# Patient Record
Sex: Female | Born: 1935 | Marital: Married
Health system: Southern US, Community
[De-identification: ages and names within clinical notes are randomized; demographics above are authoritative.]

---

## 2004-04-24 ENCOUNTER — Ambulatory Visit: Payer: Self-pay | Admitting: Obstetrics and Gynecology

## 2004-04-25 ENCOUNTER — Ambulatory Visit: Payer: Self-pay | Admitting: Obstetrics and Gynecology

## 2004-05-14 ENCOUNTER — Ambulatory Visit: Payer: Self-pay | Admitting: Gynecologic Oncology

## 2004-05-27 ENCOUNTER — Ambulatory Visit: Payer: Self-pay | Admitting: Gynecologic Oncology

## 2004-06-02 ENCOUNTER — Ambulatory Visit: Payer: Self-pay | Admitting: Gynecologic Oncology

## 2004-07-23 ENCOUNTER — Ambulatory Visit: Payer: Self-pay | Admitting: Oncology

## 2011-06-03 ENCOUNTER — Ambulatory Visit: Payer: Self-pay | Admitting: Internal Medicine

## 2011-06-11 ENCOUNTER — Inpatient Hospital Stay: Payer: Self-pay | Admitting: Specialist

## 2011-06-11 DIAGNOSIS — I517 Cardiomegaly: Secondary | ICD-10-CM

## 2011-06-11 LAB — CBC
HCT: 19.7 % — ABNORMAL LOW (ref 35.0–47.0)
HGB: 6.5 g/dL — ABNORMAL LOW (ref 12.0–16.0)
MCHC: 33 g/dL (ref 32.0–36.0)
MCV: 91 fL (ref 80–100)
RBC: 2.17 10*6/uL — ABNORMAL LOW (ref 3.80–5.20)
RDW: 14.1 % (ref 11.5–14.5)
WBC: 14.6 10*3/uL — ABNORMAL HIGH (ref 3.6–11.0)

## 2011-06-11 LAB — URINALYSIS, COMPLETE
Ketone: NEGATIVE
Ph: 7 (ref 4.5–8.0)
Protein: 30
RBC,UR: 9 /HPF (ref 0–5)
Specific Gravity: 1.021 (ref 1.003–1.030)
WBC UR: 185 /HPF (ref 0–5)

## 2011-06-11 LAB — TROPONIN I: Troponin-I: <0.02 ng/mL

## 2011-06-11 LAB — COMPREHENSIVE METABOLIC PANEL
Anion Gap: 10 (ref 7–16)
BUN: 119 mg/dL — ABNORMAL HIGH (ref 7–18)
Bilirubin,Total: 0.4 mg/dL (ref 0.2–1.0)
Chloride: 98 mmol/L (ref 98–107)
Creatinine: 3.43 mg/dL — ABNORMAL HIGH (ref 0.60–1.30)
EGFR (African American): 13 — ABNORMAL LOW
EGFR (Non-African Amer.): 11 — ABNORMAL LOW
Osmolality: 316 (ref 275–301)
Potassium: 5.5 mmol/L — ABNORMAL HIGH (ref 3.5–5.1)
SGPT (ALT): 26 U/L
Total Protein: 6.5 g/dL (ref 6.4–8.2)

## 2011-06-11 LAB — CK TOTAL AND CKMB (NOT AT ARMC)
CK, Total: 690 U/L — ABNORMAL HIGH (ref 21–215)
CK-MB: 2.2 ng/mL (ref 0.5–3.6)

## 2011-06-11 LAB — HEMOGLOBIN: HGB: 7.5 g/dL — ABNORMAL LOW (ref 12.0–16.0)

## 2011-06-12 LAB — CBC WITH DIFFERENTIAL/PLATELET
Basophil #: 0 10*3/uL (ref 0.0–0.1)
Basophil %: 0.4 %
HCT: 25 % — ABNORMAL LOW (ref 35.0–47.0)
HGB: 8.7 g/dL — ABNORMAL LOW (ref 12.0–16.0)
Lymphocyte %: 13.4 %
Monocyte #: 1.1 x10 3/mm — ABNORMAL HIGH (ref 0.2–0.9)
Monocyte %: 10.1 %
Neutrophil #: 8.2 10*3/uL — ABNORMAL HIGH (ref 1.4–6.5)
Neutrophil %: 75.9 %
Platelet: 138 10*3/uL — ABNORMAL LOW (ref 150–440)
RDW: 14.2 % (ref 11.5–14.5)
WBC: 10.9 10*3/uL (ref 3.6–11.0)

## 2011-06-12 LAB — TROPONIN I: Troponin-I: <0.02 ng/mL

## 2011-06-12 LAB — BASIC METABOLIC PANEL
Anion Gap: 11 (ref 7–16)
BUN: 89 mg/dL — ABNORMAL HIGH (ref 7–18)
Calcium, Total: 8.2 mg/dL — ABNORMAL LOW (ref 8.5–10.1)
Chloride: 105 mmol/L (ref 98–107)
Co2: 24 mmol/L (ref 21–32)
EGFR (African American): 23 — ABNORMAL LOW
EGFR (Non-African Amer.): 20 — ABNORMAL LOW
Glucose: 209 mg/dL — ABNORMAL HIGH (ref 65–99)
Osmolality: 313 (ref 275–301)

## 2011-06-12 LAB — CK TOTAL AND CKMB (NOT AT ARMC)
CK, Total: 582 U/L — ABNORMAL HIGH (ref 21–215)
CK-MB: 1 ng/mL (ref 0.5–3.6)

## 2011-06-13 LAB — BASIC METABOLIC PANEL
Anion Gap: 9 (ref 7–16)
BUN: 51 mg/dL — ABNORMAL HIGH (ref 7–18)
Calcium, Total: 7.7 mg/dL — ABNORMAL LOW (ref 8.5–10.1)
Co2: 23 mmol/L (ref 21–32)
Creatinine: 1.25 mg/dL (ref 0.60–1.30)
EGFR (African American): 44 — ABNORMAL LOW
EGFR (Non-African Amer.): 38 — ABNORMAL LOW
Osmolality: 301 (ref 275–301)
Potassium: 3.4 mmol/L — ABNORMAL LOW (ref 3.5–5.1)

## 2011-06-13 LAB — CBC WITH DIFFERENTIAL/PLATELET
Basophil #: 0 10*3/uL (ref 0.0–0.1)
HCT: 28.2 % — ABNORMAL LOW (ref 35.0–47.0)
HGB: 9.6 g/dL — ABNORMAL LOW (ref 12.0–16.0)
Lymphocyte #: 1.5 10*3/uL (ref 1.0–3.6)
MCH: 30.3 pg (ref 26.0–34.0)
MCHC: 34 g/dL (ref 32.0–36.0)
MCV: 89 fL (ref 80–100)
Monocyte %: 9.7 %
Neutrophil #: 7.1 10*3/uL — ABNORMAL HIGH (ref 1.4–6.5)
Platelet: 141 10*3/uL — ABNORMAL LOW (ref 150–440)
WBC: 9.6 10*3/uL (ref 3.6–11.0)

## 2011-06-13 LAB — TSH: Thyroid Stimulating Horm: 0.591 u[IU]/mL

## 2011-06-13 LAB — URINE CULTURE

## 2011-06-14 LAB — BASIC METABOLIC PANEL
BUN: 43 mg/dL — ABNORMAL HIGH (ref 7–18)
Chloride: 114 mmol/L — ABNORMAL HIGH (ref 98–107)
Co2: 22 mmol/L (ref 21–32)
EGFR (African American): 52 — ABNORMAL LOW
Glucose: 185 mg/dL — ABNORMAL HIGH (ref 65–99)
Potassium: 3.5 mmol/L (ref 3.5–5.1)
Sodium: 144 mmol/L (ref 136–145)

## 2011-06-14 LAB — CBC WITH DIFFERENTIAL/PLATELET
Basophil #: 0 10*3/uL (ref 0.0–0.1)
Basophil %: 0.3 %
Eosinophil %: 0.1 %
HCT: 21.3 % — ABNORMAL LOW (ref 35.0–47.0)
HGB: 7.1 g/dL — ABNORMAL LOW (ref 12.0–16.0)
Lymphocyte #: 1.2 10*3/uL (ref 1.0–3.6)
MCHC: 33.6 g/dL (ref 32.0–36.0)
MCV: 90 fL (ref 80–100)
Monocyte %: 10.7 %
Platelet: 138 10*3/uL — ABNORMAL LOW (ref 150–440)
RBC: 2.36 10*6/uL — ABNORMAL LOW (ref 3.80–5.20)
WBC: 8.2 10*3/uL (ref 3.6–11.0)

## 2011-06-15 LAB — URINALYSIS, COMPLETE
Bilirubin,UR: NEGATIVE
Glucose,UR: >=500 mg/dL (ref 0–75)
Leukocyte Esterase: NEGATIVE
Squamous Epithelial: NONE SEEN

## 2011-06-15 LAB — CBC WITH DIFFERENTIAL/PLATELET
Basophil #: 0 10*3/uL (ref 0.0–0.1)
Basophil %: 0.2 %
Eosinophil #: 0 10*3/uL (ref 0.0–0.7)
HGB: 8.8 g/dL — ABNORMAL LOW (ref 12.0–16.0)
MCH: 30 pg (ref 26.0–34.0)
MCHC: 33.9 g/dL (ref 32.0–36.0)
MCV: 88 fL (ref 80–100)
Monocyte #: 0.9 x10 3/mm (ref 0.2–0.9)
Monocyte %: 8.8 %
Neutrophil #: 6.9 10*3/uL — ABNORMAL HIGH (ref 1.4–6.5)
Neutrophil %: 71 %
Platelet: 147 10*3/uL — ABNORMAL LOW (ref 150–440)
RDW: 14 % (ref 11.5–14.5)
WBC: 9.8 10*3/uL (ref 3.6–11.0)

## 2011-06-17 LAB — BASIC METABOLIC PANEL
Anion Gap: 11 (ref 7–16)
BUN: 20 mg/dL — ABNORMAL HIGH (ref 7–18)
Co2: 20 mmol/L — ABNORMAL LOW (ref 21–32)
Creatinine: 0.73 mg/dL (ref 0.60–1.30)
Glucose: 196 mg/dL — ABNORMAL HIGH (ref 65–99)
Sodium: 145 mmol/L (ref 136–145)

## 2011-06-17 LAB — CBC WITH DIFFERENTIAL/PLATELET
Basophil %: 0.3 %
Eosinophil #: 0.2 10*3/uL (ref 0.0–0.7)
HCT: 24.8 % — ABNORMAL LOW (ref 35.0–47.0)
HGB: 8.1 g/dL — ABNORMAL LOW (ref 12.0–16.0)
Lymphocyte %: 15.5 %
MCHC: 32.7 g/dL (ref 32.0–36.0)
MCV: 90 fL (ref 80–100)
Monocyte #: 0.9 x10 3/mm (ref 0.2–0.9)
Neutrophil #: 7.1 10*3/uL — ABNORMAL HIGH (ref 1.4–6.5)
Neutrophil %: 73.1 %
RDW: 14.1 % (ref 11.5–14.5)

## 2011-07-04 ENCOUNTER — Ambulatory Visit: Payer: Self-pay | Admitting: Internal Medicine

## 2011-08-03 DEATH — deceased

## 2012-05-23 IMAGING — CR PELVIS - 1-2 VIEW
1 series · 2 of 2 positions shown · non-contrast
Comparison: none

REASON FOR EXAM: trauma
COMMENTS:

[Series 1: t pelvis ap · 0.14mm/px · 2 of 2 slices shown]
[im 1/2]
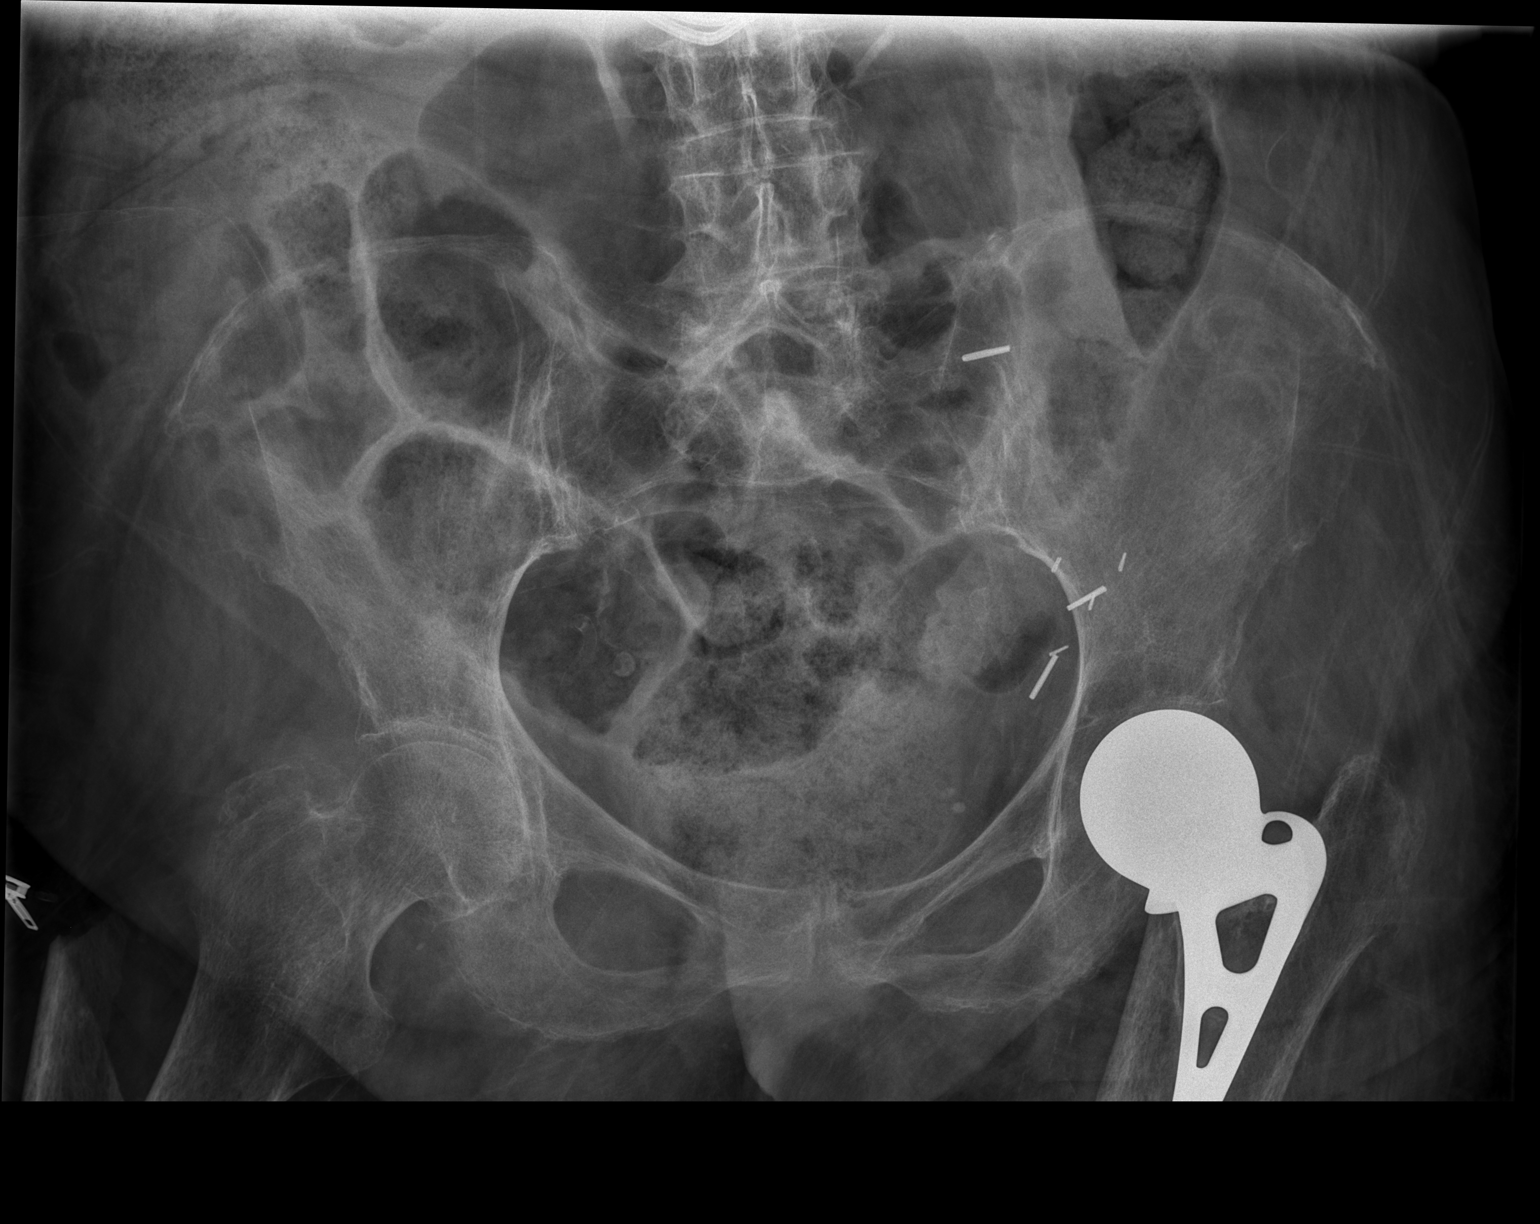
[im 2/2]
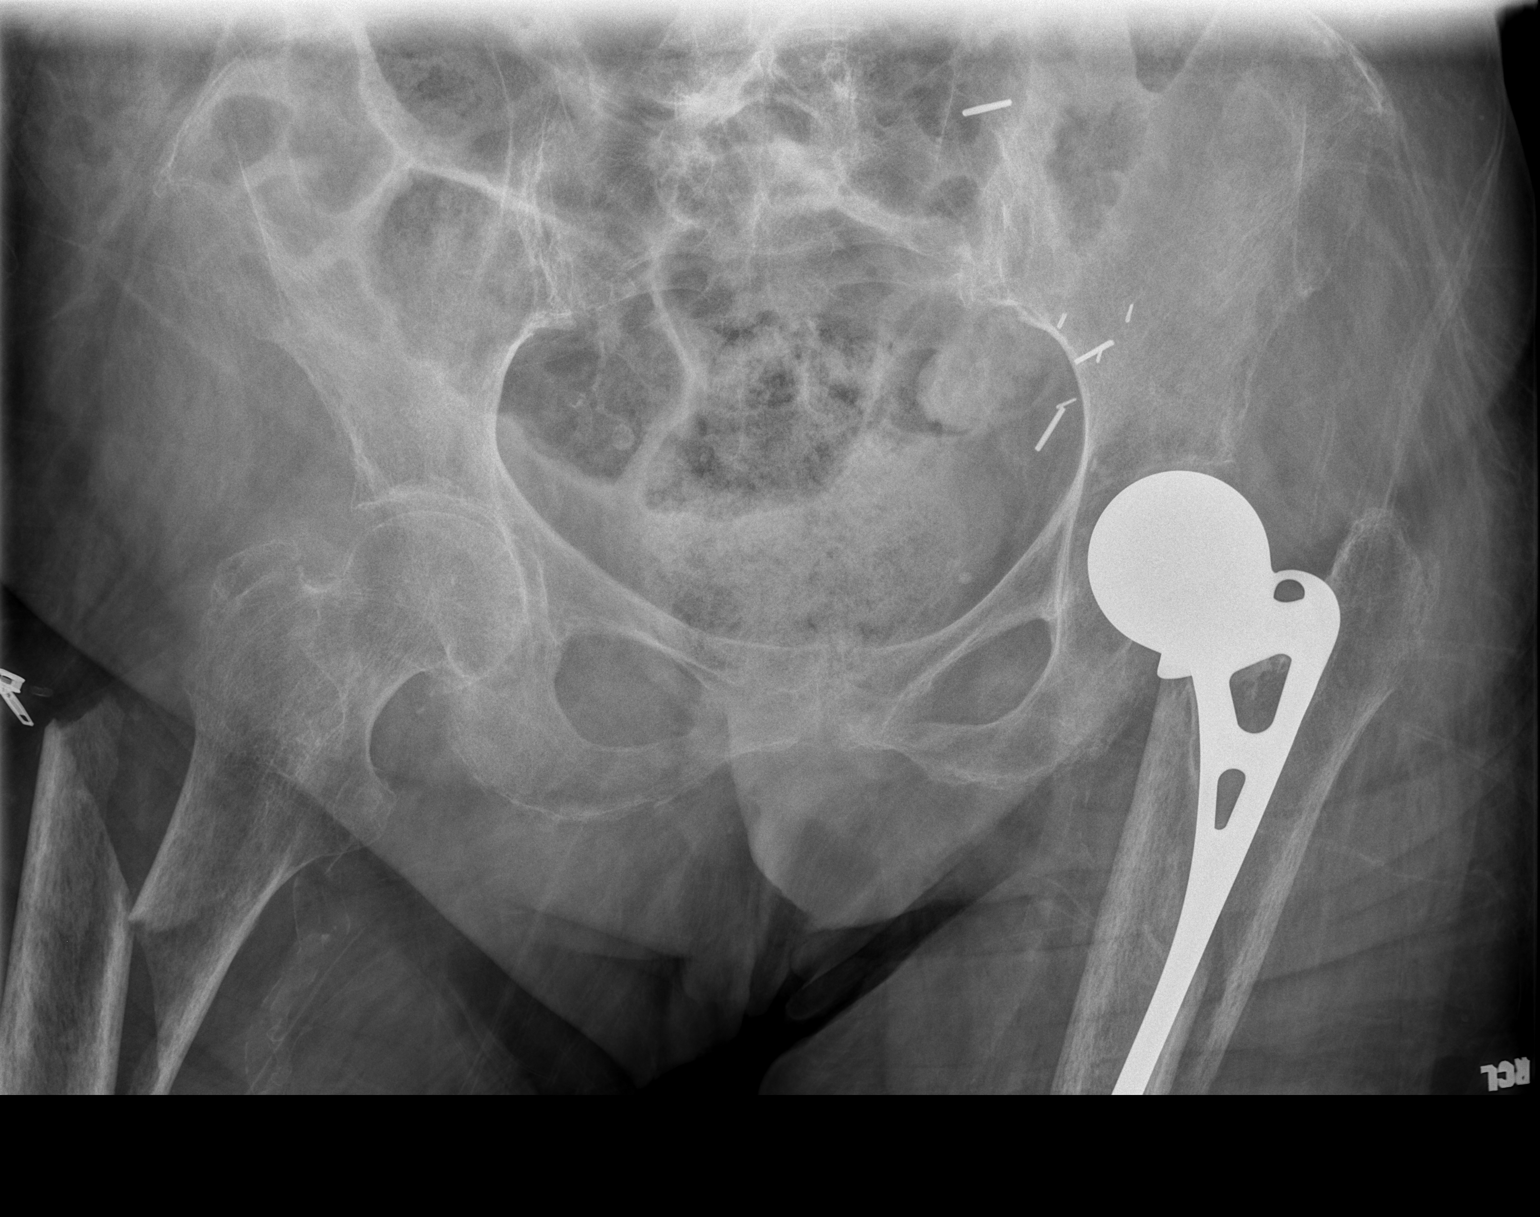

[2 of 2 positions shown; findings below may reference images not displayed]

PROCEDURE:     DXR - DXR PELVIS AP ONLY  - June 11, 2011 [DATE]

RESULT:     The bones are osteopenic. There is a prosthetic device in the
proximal left femur with a fracture present. There is also a fracture in the
proximal right femur obliquely. There is significant distraction in the
right femur. Mild distraction is seen in the proximal left femur. No
definite pubic symphysis or pubic ramus fracture is seen this area is poorly
demonstrated. Degenerative changes are noted in the sacroiliac joints.
IMPRESSION: 1. Acute fractures bilaterally in the proximal femora. Orthopedic surgical
consultation recommended.

[REDACTED]

## 2012-05-27 IMAGING — US US EXTREM UP VENOUS*R*
1 series · 14 of 24 positions shown · non-contrast
Comparison: none

REASON FOR EXAM: Right arm redness/swelling.
COMMENTS:

[Series 1: us extrem up venous*right* · 14 of 30 slices shown]
[im 1/30]
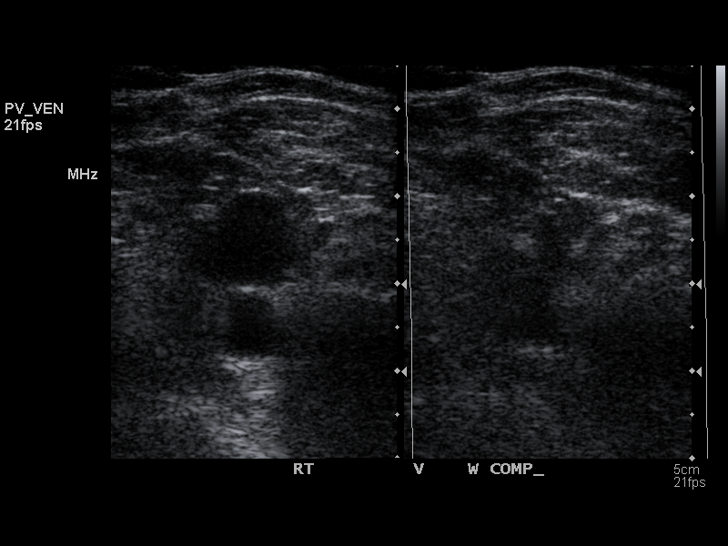
[im 3/30]
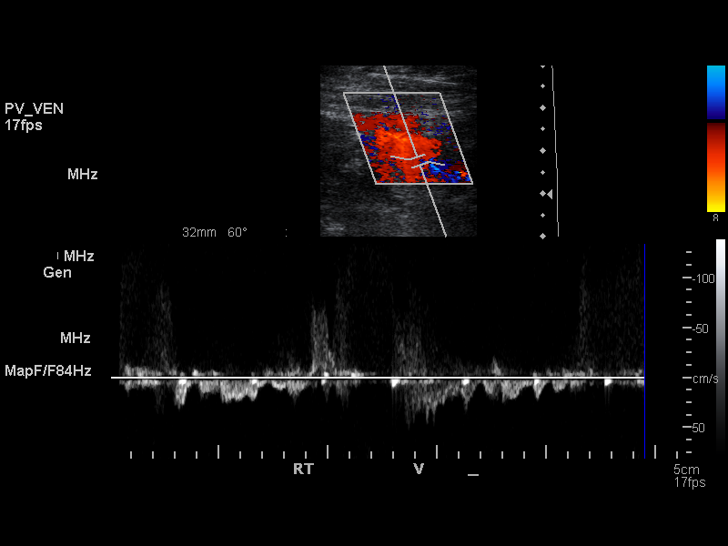
[im 6/30]
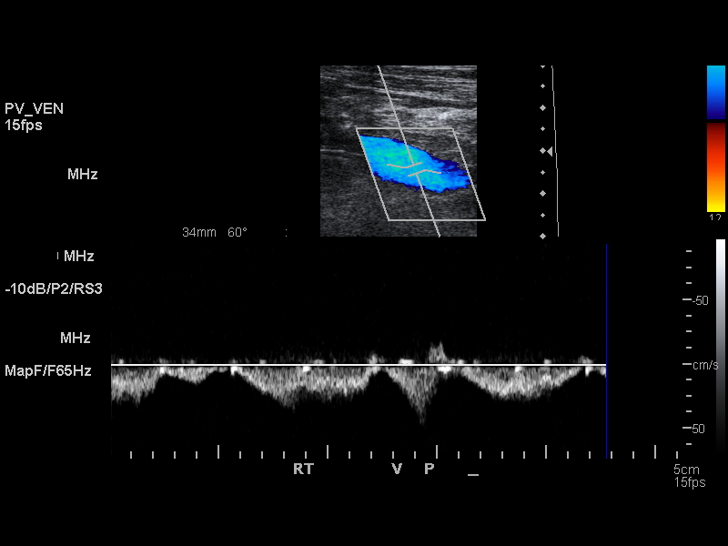
[im 8/30]
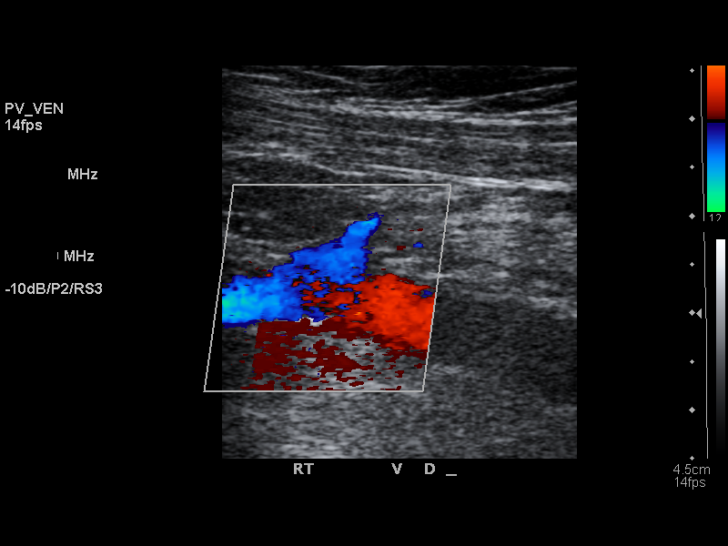
[im 9/30]
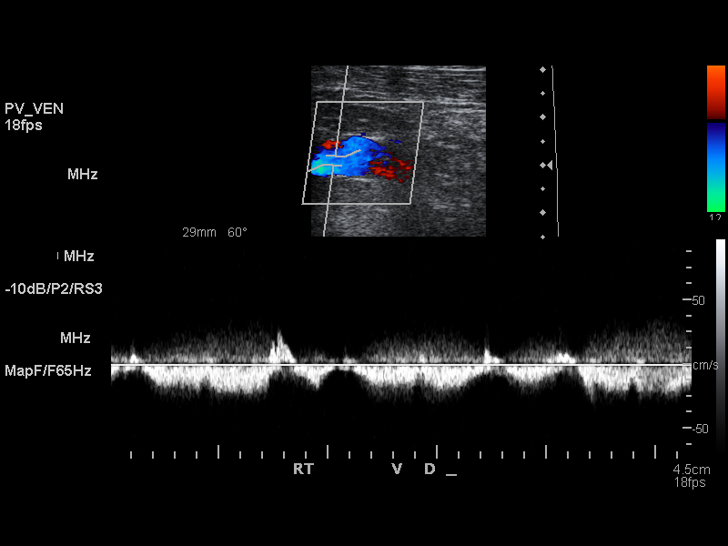
[im 12/30]
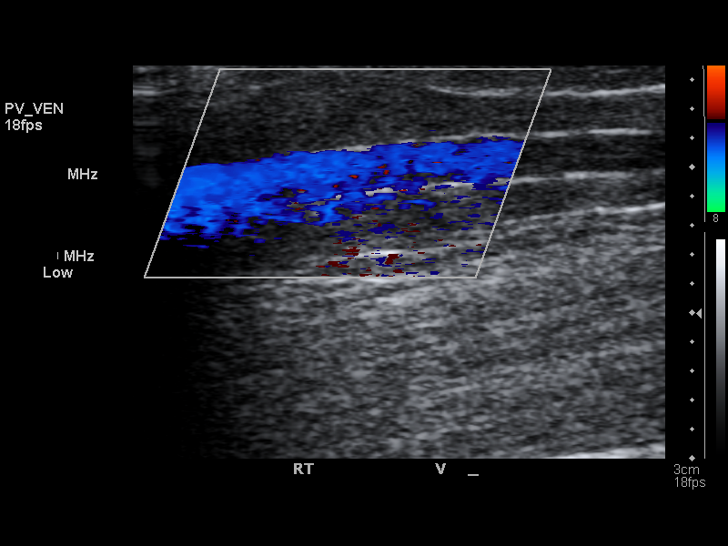
[im 14/30]
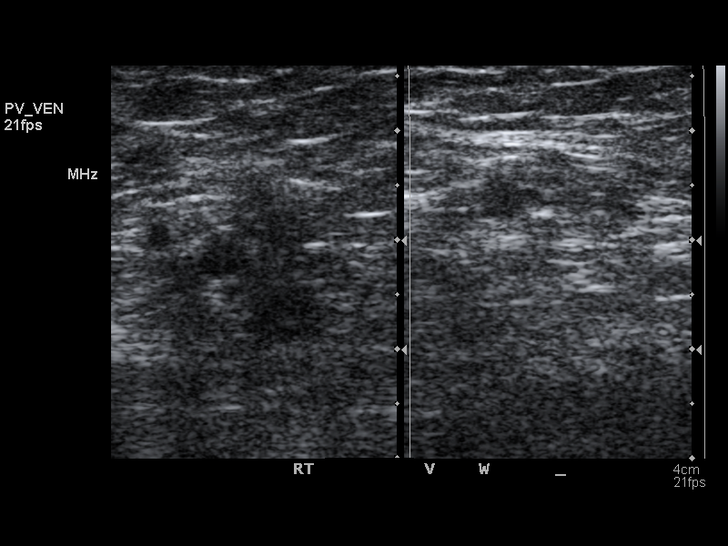
[im 16/30]
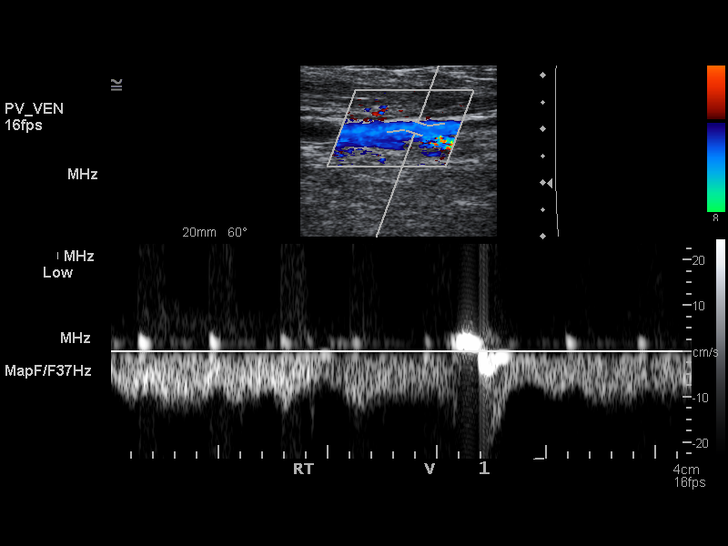
[im 18/30]
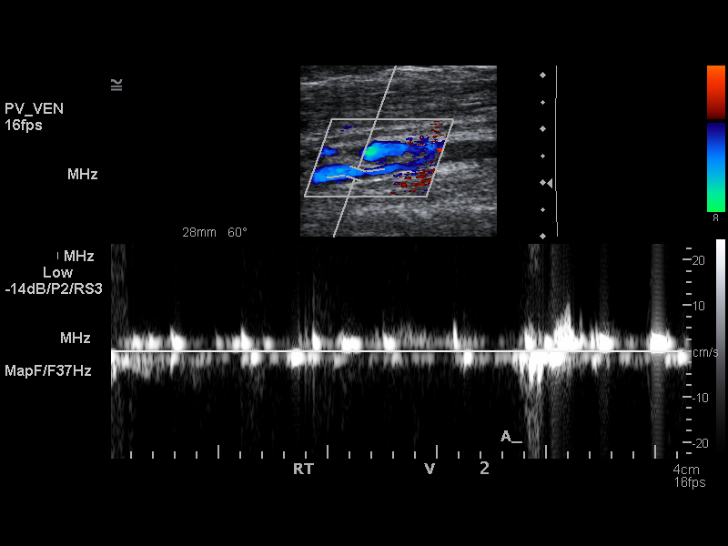
[im 21/30]
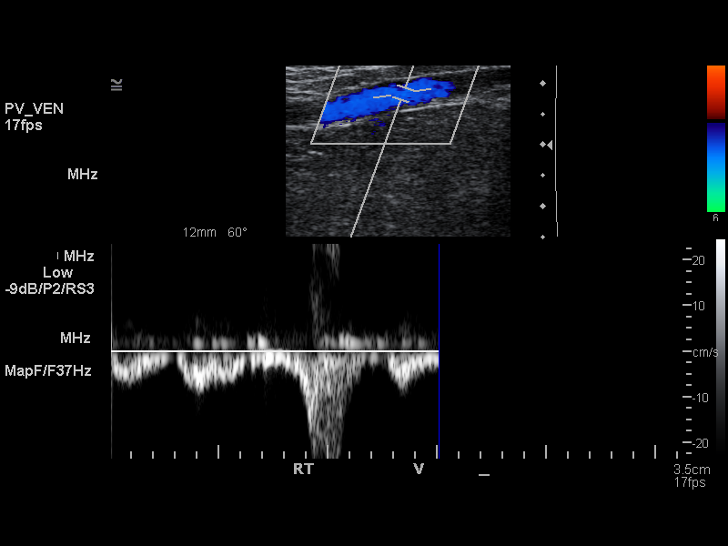
[im 23/30]
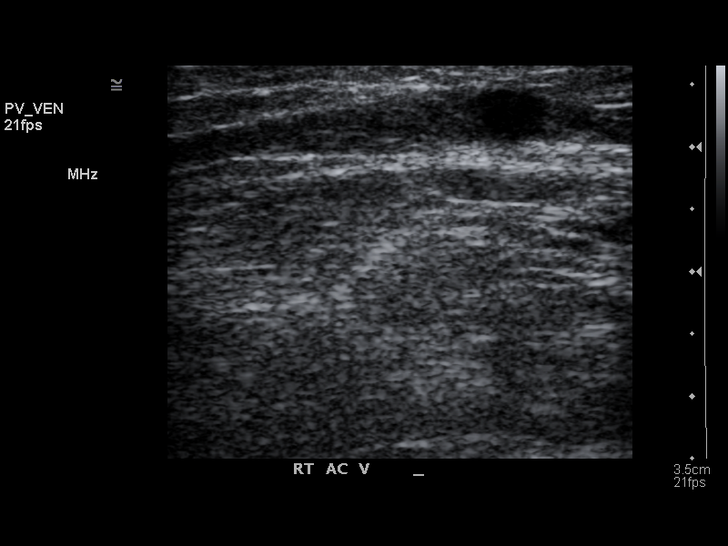
[im 24/30]
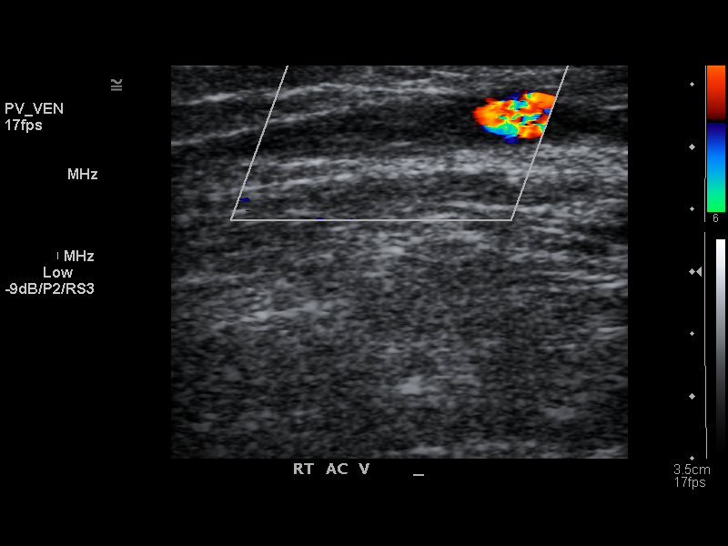
[im 27/30]
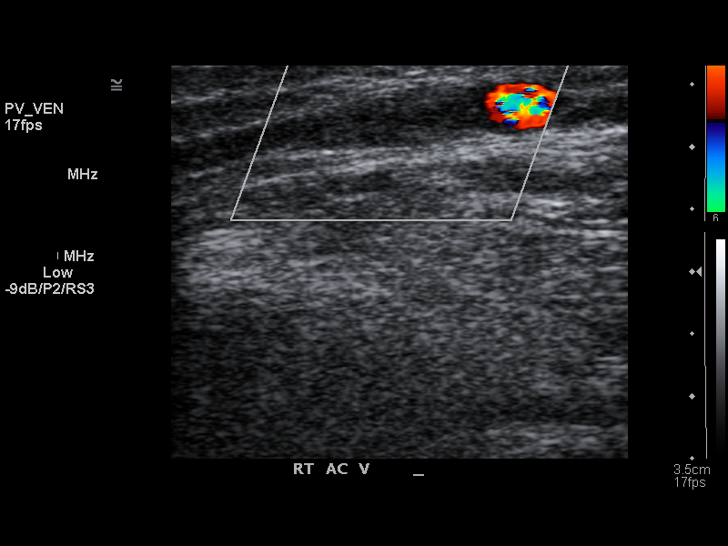
[im 30/30]
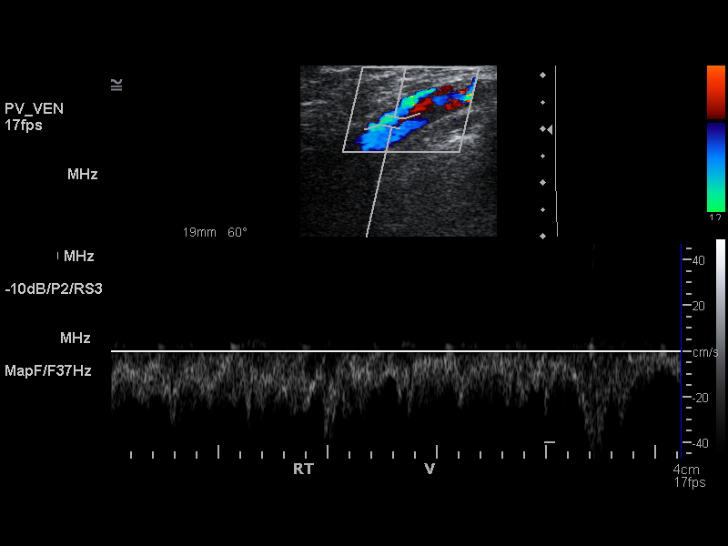

[14 of 24 positions shown; findings below may reference images not displayed]

PROCEDURE:     US  - US DOPPLER UP EXTR RIGHT  - June 15, 2011  [DATE]

RESULT:     The venous system of the right arm is interrogated from the
level of the inferior jugular vein to the elbow. The subclavian, axillary
and brachial veins are patent. The cephalic and basilic veins show no
thrombus or occlusion. The antecubital vein is visualized and is occluded by
thrombus.
IMPRESSION: There is noted occlusive thrombus in the antecubital vein. No additional
areas of thrombus are identified in the venous system of the right upper arm.

## 2012-05-27 IMAGING — CR DG CHEST 1V PORT
1 series · 1 of 1 positions shown · non-contrast
Comparison: none

REASON FOR EXAM: Post-op Fever
COMMENTS:

[portable]
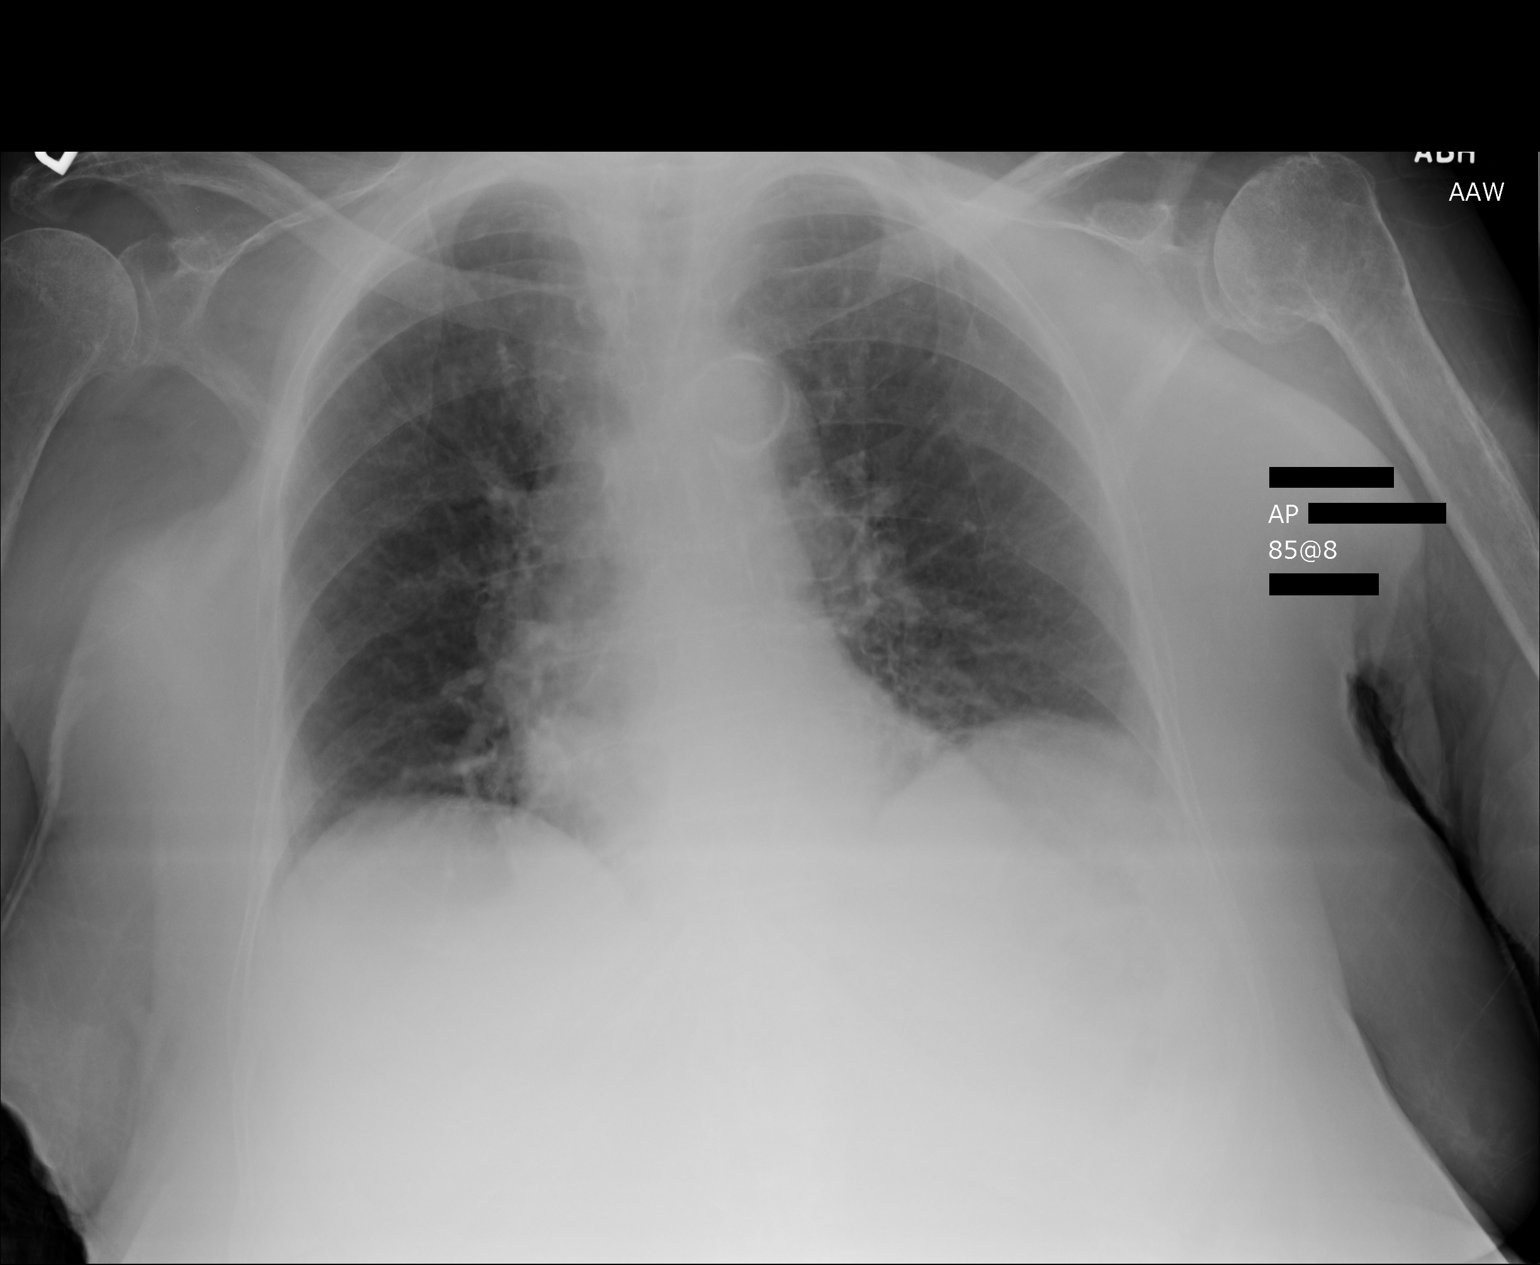

[1 of 1 positions shown; findings below may reference images not displayed]

PROCEDURE:     DXR - DXR PORTABLE CHEST SINGLE VIEW  - June 15, 2011 [DATE]

RESULT:     Comparison is made to the prior exam of 06/11/2011. The lung
fields are clear. No pneumonia, pneumothorax or pleural effusion is seen.
There is slight thickening of the basilar markings medially on the left
consistent with crowding of lung markings due to the lack of deep
inspiration. Heart size is normal. No acute bony abnormalities are seen.
IMPRESSION: No acute changes are identified.

## 2014-05-27 NOTE — Consult Note (Signed)
PATIENT NAME:  Tina Marsh, [name] MR#:  [id number] DATE OF BIRTH:  April 15, 1917  DATE OF CONSULTATION:  06/12/2011  REFERRING PHYSICIAN:   CONSULTING PHYSICIAN:  Leitha SchullerMichael J. Gera Inboden, MD  REASON FOR CONSULTATION: Bilateral femur fractures.   HISTORY OF PRESENT ILLNESS: The patient is a 81 year old non-ambulator who had a reported fall from a lift while at the nursing home where she resides.  She was placed back in bed.  The date of injury was two days prior to admission.   Subsequently on Thursday it was noted that she was having swelling and deformity to her right proximal thigh and she was sent to the Emergency Room where x-rays obtained showed bilateral femur fractures. She was admitted and stabilized. She had significant blood loss anemia probably related to the bilateral femur fractures. She, again, is nonambulatory. On exam she has swelling to both thighs, right more than left with significant deformity on the right with external rotation. The left leg has swelling proximally without rotational deformity. She has pain with any logrolling of either leg. Distally she does have palpable pulses. They are trace. She did flex and extend the toes slightly, but it is difficult to get exam secondary to the patient's confusion. X-rays show a left periprosthetic femur fracture, long oblique, around an old Romilda JoyAustin Moore prosthesis. On the right a spiral proximal shaft fracture.   CLINICAL IMPRESSION: Bilateral femur fractures.   RECOMMENDATION: I discussed with her daughter treatment options of nonoperative with pain medication. Because of the patient's severe pain the patient's daughter requests repair of fractures. I discussed what that would entail with her being nonambulatory. We want to get it stabilized but not as rigid as in an ambulator so we will just cerclage wires around the left proximal femur rod on the right. We discussed the risks, benefits, and possible complications, in particular blood clots and  infection and perioperative medical complications. She understands and requests that we do this expeditiously. She has now been stabilized and we will plan on doing this tomorrow morning. She will be talking to other siblings to confirm that is their wish as well.  ____________________________ Leitha SchullerMichael J. Rual Vermeer, MD mjm:bjt D:  06/12/2011 12:08:23 ET         T: 06/12/2011 13:14:09 ET         JOB#: [id number]  Nolon BussingMICHAEL J Fayette Gasner MD ELECTRONICALLY SIGNED 06/12/2011 17:06

## 2014-05-27 NOTE — Discharge Summary (Signed)
PATIENT NAME:  Tina Marsh, Tina Marsh MR#:  [id number] DATE OF BIRTH:  1917-03-20  DATE OF ADMISSION:  06/11/2011 DATE OF DISCHARGE:  06/18/2011  CONSULTANTS: 1. Kennedy Bucker, MD - Orthopedics.  2. Ned Grace, MD - Palliative Care.   PERTINENT STUDIES: CT scan of the head done without contrast on admission showed mild ventriculomegaly and mild atrophy, but no other acute changes.   An ultrasound of the right upper extremity showed occlusive thrombus at the antecubital vein.   A chest x-ray done on 06/15/2011 showed no acute changes.   X-ray of the right femur on 06/11/2011 showed proximal right femoral fracture.   The patient also had x-ray of the left femur, limited study, which showed a fracture of the proximal portion of the left femur.   Urine culture is positive for Proteus.   DISCHARGE DIAGNOSES:  1. Status post fall and bilateral hip fractures.  2. Urinary tract infection.  3. Acute renal failure.  4. Hypovolemic shock.  5. Acute post-hemorrhagic anemia secondary to hip fractures.  6. Rhabdomyolysis.  7. Diabetes.  8. Failure to thrive.  9. Gastroesophageal reflux disease.  10. Right upper extremity superficial deep vein thrombosis.   DISPOSITION: The patient is presently being discharged to the hospice home.  ACTIVITY: As tolerated.  DIET: As tolerated.   DISCHARGE MEDICATIONS:  1. Roxanol 20 mg every 1 to 2 hours as needed.  2. Lorazepam 0.5 to 1 mg every 2 to 4 hours p.r.n.  3. Ranitidine 150 mg twice a day.  HOSPITAL COURSE: This is a 81 year old female with multiple medical problems as mentioned above who presented to the hospital on 06/11/2011 after suffering a fall and noted to have bilateral hip fractures. She was also noted to be in hypovolemic shock and acute renal failure and also anemia. Therefore, she was admitted to the hospitalist service.  1. Hypovolemic shock: This was secondary to severe anemia after suffering a fall and having a hematoma near her hips  and also with severe dehydration. The patient received some packed red blood cells and also aggressive IV fluids and since then her hypovolemia and shock has improved. She was noted to have a urinary tract infection, but she was not septic and that was not the source of her hypotension.  2. Acute renal failure: The patient presented to the hospital with a creatinine above 3. This was likely thought to be prerenal azotemia from dehydration and volume loss. She was transfused 2 units of packed red blood cells and also aggressive IV fluids and her renal function since then has improved and currently is at baseline.  3. Bilateral hip fractures: This was secondary to the fall she suffered at the skilled nursing facility. Since she was hypotensive and severely anemic and in renal failure she was not operated on right away, although when her renal function improved she was taken to surgery by Dr. Rosita Kea who performed ORIF to the right hip with wires to the left. She normally is bed bound, even prior to coming in. She currently is receiving some mild pain control. Otherwise there are no acute issues related to this at this time.  4. Urinary tract infection: Her urine culture grew out Proteus. It was pansensitive, other than Macrobid. The patient has received a five day course of ciprofloxacin to treat her urinary tract infection.  5. Rhabdomyolysis: This was likely secondary to her fall and her hip fractures. The patient was aggressively hydrated with IV fluids and her CKs have trended down and  now normalized.  6. Diabetes: The patient has been maintained on sliding scale insulin coverage and Januvia and her sugars have remained stable.  7. Right upper extremity superficial deep vein thrombosis: The patient had some right arm swelling during the hospital course. I obtained an ultrasound of the right upper extremity which showed a superficial deep vein thrombosis of the antecubital vein. I discussed this with vascular  surgery and does not require any treatment. We kept her arm elevated and avoided any blood pressure checks or blood draws on that arm and since then has improved.   Postoperatively the patient's clinical condition overall has not improved. She seems to be increasingly weak, has poor p.o. intake and appears very lethargic. Given her advanced age and multiple comorbidities, I obtained a hospice screen and a palliative care consult. After their discussion with the family, the patient was made DO NOT RESUSCITATE. Since the patient's clinical condition has not really been improving, the family opted for discharging the patient to a hospice facility where presently she is being discharged to. The patient is a DO NOT RESUSCITATE.   TIME SPENT ON DISCHARGE:  40 minutes  ____________________________ Rolly PancakeVivek J. Cherlynn KaiserSainani, MD vjs:slb D: 06/18/2011 10:59:57 ET T: 06/18/2011 12:00:03 ET JOB#: [id number]  cc: Rolly PancakeVivek J. Cherlynn KaiserSainani, MD, <Dictator> Houston SirenVIVEK J Jaspreet Hollings MD ELECTRONICALLY SIGNED 06/18/2011 15:43

## 2014-05-27 NOTE — H&P (Signed)
PATIENT NAME:  Tina Marsh, Tina Marsh MR#:  [id number] DATE OF BIRTH:  [birth year]  DATE OF ADMISSION:  06/11/2011  PRIMARY CARE PHYSICIAN: Dr. Clayborn Bigness  CHIEF COMPLAINT: Fall and altered mental status.   HISTORY OF PRESENT ILLNESS: History is obtained from patient's daughter who is in the he ER room along with ER physician. Patient is unable to provide any history or review of systems secondary to her altered mental status. Tina Marsh is a pleasant 81 year old Caucasian female with multiple medical problems of degenerative joint disease, osteoarthritis, osteoporosis, hypothyroidism, diabetes and hypertension who is a resident at [facility], is mainly bedbound and wheelchair bound, does not walk for many years which is her baseline comes to the Emergency Room after staff was not able to arouse her/wake her up. Patient apparently slipped off her lift while they were trying to place her in bed from the wheelchair at [facility] Tuesday night. She started complaining of pain in her left hip and they also saw that her right leg looked rotated. X-rays of the right leg were done and was it noted patient has proximal femoral fracture. She was brought here. She was brought to the Emergency Room and it was noted patient has bilateral lower extremity deformities hence x-rays of the right and left femur along with pelvis were done which shows bilateral proximal femoral fractures. Patient has left proximal femur prosthesis from previous surgery in 2006. In the Emergency Room patient was also noted to be hypotensive with severe dehydration. Creatinine was 3.49. She was also found to be severely anemic with hemoglobin of 6.5. Patient is being admitted with hypovolemic shock, posthemorrhagic anemia along with encephalopathy, likely uremic with bilateral femoral proximal fractures.   PAST MEDICAL HISTORY:  1. Gastroesophageal reflux disease.  2. Allergic rhinitis.  3. Vitamin D deficiency.   4. Hypothyroidism.  5. Urinary retention.  6. Type 2 diabetes.  7. Urinary incontinence.  8. Degenerative joint disease.  9. Degenerative arthritis.  10. History of chronic dysphagia.  11. Chronic abdominal pain.  12. Osteoporosis.  13. History of depression.  14. Hypertension.  15. History of left humerus fracture.  16. History of left femoral fracture status post prosthesis.  17. History of endometrial cancer status post hysterectomy.   ALLERGIES: Salicylates and aspirin.   MEDICATIONS:  1. Levothyroxine 88 mcg p.o. daily.  2. Flomax 0.4 mg p.o. daily.  3. Pro-Stat p.o. t.i.d. for wound.  4. Miacalcin 200 units 1 puff in alternating nostrils daily.   5. Glucotrol XL 5 mg p.o. daily.  6. Glucophage 500 mg 1 tablet b.i.d.  7. Ferrous sulfate 325 mg p.o. daily.  8. Multivitamin p.o. daily.  9. Verapamil 360 SR p.o. daily.  10. Lasix 20 mg daily.  11. Vitamin D 400 units 2 tablets by mouth daily.  12. K-Dur 20 mEq p.o. every other day.   13. Prilosec 20 mg p.o. daily.  14. Januvia 100 mg p.o. daily.  15. Lotensin 40 mg p.o. daily.  16. Calcium with vitamin D 1 tablet 3 times a day.  17. Librax 200 mg p.o. daily.  18. Remeron 15 mg at bedtime.  19. Zoloft 150 mg p.o. at bedtime.  20. Senokot-S 2 tablets at bedtime.  21. Tylenol 325 mg 2 tablets at bedtime for pain.  22. Sliding scale insulin.  23. Claritin 10 mg p.o. daily as needed.   FAMILY HISTORY: Unobtainable.   SOCIAL HISTORY: Patient is a resident at [facility]. Nonsmoker. Nonalcoholic.   REVIEW  OF SYSTEMS: Unobtainable. Patient is very lethargic.   PHYSICAL EXAMINATION:  GENERAL: Patient is elderly looking 81 year old.  VITAL SIGNS: Temperature 99.3, pulse 97, blood pressure 115/30, sats 94% on room air.   HEENT: Atraumatic, normocephalic. Pupils are equal, round, and reactive to light and accommodation. Eyeballs appear sunken. Oral mucosa is dry.  NECK: Supple. No JVD. No carotid bruit.    RESPIRATORY: Clear to auscultation bilaterally. No rales, rhonchi, respiratory distress, or labored breathing.   CARDIOVASCULAR: Tachycardia present. No murmur heard. PMI not lateralized. Chest nontender.   EXTREMITIES: Pedal pulses much not palpable, very feeble. Femoral pulses are palpable.    SKIN: Warm and dry. There is some venous stasis changes in the lower extremities. Patient's bilateral lower extremity looks internally rotated.   ABDOMEN: Soft, benign, nontender. No organomegaly.   NEUROLOGIC: Unable to test secondary to altered mental status.   LABORATORY, DIAGNOSTIC AND RADIOLOGICAL DATA: Urinalysis positive for urinary tract infection.   Chest x-ray: Prominent atherosclerotic calcification. Cannot exclude some minimal inferior right upper lobe atelectasis or less likely an early infiltrate.   Pelvis AP shows acute fracture bilaterally in the proximal femur.   CT of the head without contrast shows mild atrophy, mild ventriculomegaly. White matter changes consistent with chronic ischemia.   CPK 690. MB 2.2.   Glucose 239, BUN 119, creatinine 3.43, sodium 135, potassium 5.5, chloride 98, bicarbonate 27, albumin 3.0, hemoglobin and hematocrit 6.5 and 19.7, white count 14.6, platelet count 176. Troponin 0.02.   ASSESSMENT: 81 year old Tina Marsh with:  1. Shock, appears hypovolemic.  2. Acute renal failure/uremia. Baseline creatinine 1.3 in February 2013.  3. Severe posthemorrhagic anemia suspect from fracture loss, cannot rule out GI bleed at present. Hemoglobin March 2013 was 10.6, no melena or blood in stools per daughter. No colonoscopy or EGD in the past per daughter.  4. Bilateral femoral fracture suspect from patient slipping off the lift at nursing home. She is bedbound and wheelchair bound for many years. She has left femoral prosthesis.  5. Urinary tract infection/systemic inflammatory response syndrome.  6. Metabolic/uremic encephalopathy.  7. History of type 2  diabetes.  8. Hypothyroidism.  9. Osteoarthritis.  10. Degenerative joint disease.  11. Osteoporosis.   PLAN:  1. Admit patient to Critical Care Unit.  2. IV fluids wide open for hydration.  3. Use IV pressors if needed.  4. Transfuse 1 unit of blood transfusion at present; follow up hemoglobin and hematocrit and transfuse as needed.  5. Hemoccult stools.  6. Follow up cardiac panel.  7. Occult feces.   8. Will start patient on Cipro 400 b.i.d. for urinary tract infection. Follow up urine culture, blood culture.  9. Will start patient currently on Tylenol p.o. and/or rectal suppository; given her renal failure try to avoid opiates for now.  10. IV Protonix.  11. Echo Doppler of the heart.  12. Orthopedic consultation with Dr. Rosita KeaMenz. Will inform Dr. Rosita KeaMenz of the consult.  13. Deep vein thrombosis prophylaxis with SCDs and TEDs. Patient is severely anemic, will avoid antiplatelet or any other anticoagulants.  14. GI prophylaxis with IV Protonix.  15. Above was discussed with patient's daughter, who is the HCPOA. Patient carries a LIVING WILL. She is a FULL CODE for now.  16. Further work-up according to patient's clinical course. Hospital admission plan was discussed with patient's daughter. She carries a poor prognosis at present.   CRITICAL TIME SPENT: 60 minutes.   ____________________________ Wylie HailSona A. Allena KatzPatel, MD sap:cms D: 06/11/2011 14:12:49 ET T:  06/11/2011 15:11:08 ET JOB#: [id number]  cc: Judeen Geralds A. Allena Katz, MD, <Dictator> Burley Saver, MD  Willow Ora MD ELECTRONICALLY SIGNED 06/14/2011 13:57

## 2014-05-27 NOTE — Consult Note (Signed)
Brief Consult Note: Diagnosis: left periprosthetic femur fracture and right femoral shaft fracture.   Patient was seen by consultant.   Comments: will need to discuss options with family less invasive surgery would be possible and would be palliative, needs to be stabilized medially first in any case.  Electronic Signatures: Leitha SchullerMenz, Tyion Boylen J (MD)  (Signed 09-May-13 18:38)  Authored: Brief Consult Note   Last Updated: 09-May-13 18:38 by Leitha SchullerMenz, Aliece Honold J (MD)

## 2014-05-27 NOTE — Op Note (Signed)
PATIENT NAME:  Tina Marsh, [name] MR#:  [id number] DATE OF BIRTH:  October 22, 1917  DATE OF PROCEDURE:  06/13/2011  PREOPERATIVE DIAGNOSES:  1. Right femoral shaft fracture. 2. Left periprosthetic femur fracture.   POSTOPERATIVE DIAGNOSES:  1. Right femoral shaft fracture. 2. Left periprosthetic femur fracture.   PROCEDURES:  1. Open reduction internal fixation right femoral shaft. 2. ORIF left periprosthetic femur fracture.   SURGEON: Leitha SchullerMichael J. Avraj Lindroth, MD  ANESTHESIA: General.   ESTIMATED BLOOD LOSS: 250 mL.    DESCRIPTION OF PROCEDURE: Patient was brought to the Operating Room and placed on the fracture tables after general anesthesia been obtained. The right leg was approached first with the foot placed in the traction boot and traction applied. The hip was prepped and draped using the barrier drape method. A proximal incision was made at the level of the greater trochanter and a guidewire inserted. Proximal reaming was carried out. Guidewire inserted down the canal and reaming to 11 mm done with measuring of the rod off the guidewire performed. A right Affixus hip fracture nail 9 x 340 mm was then inserted down the canal without difficulty. A small incision was made laterally and a guidewire inserted up into the head at the appropriate level drilling, measuring and placing the lag screw which was then tightened proximally. The insertion handle was then removed and the distal interlock placed using standard technique making a small incision, drilling, measuring and placing a bicortical screw through the distal screw hole. At this point the fracture appeared adequately reduced with acceptable position on AP and lateral projections. The wounds were thoroughly irrigated and closed with 2-0 Vicryl subcutaneously and skin staples. Xeroform, 4 x 4's, ABD and tape applied. The table portion of the bed was reapplied and the right leg placed on this. The left leg was placed in the traction boot and closed  reduction of the periprosthetic fracture performed. A proximal incision was made with some difficulty secondary to soft tissue tension initial beaded cable and sleeve around the femur. Once  this had been done around the [employer] periprostatic fracture tray instrumentation and crimping this first wire was much easier after releasing traction so the muscle was not so tight around the proximal femur. Two additional K wires were placed, one more proximal at the level of the lesser trochanter, one more distally. There did appear to be very good compression of the fracture site. C-arm views as well as direct palpation of the posterior fracture line rotation also appeared correct. The wound was thoroughly irrigated after the three wires had been placed, tightened and crimped. The wound was closed using #1 Vicryl for the iliotibial band, 2-0 Vicryl subcutaneously and skin staples. Xeroform, 4 x 4's, ABD, and tape applied. Patient was sent to recovery room in stable condition.   IMPLANTS: Biomet Affixus hip nail 130 degree 9 x 340 with a 105 mm lag screw and a 44 mm distal bone screw. On the left hip three 1.7 mm cable with crimp sleeve applied.    CONDITION: To recovery room stable.   COMPLICATIONS: None.   SPECIMEN: None.   ____________________________ Leitha SchullerMichael J. Anabia Weatherwax, MD mjm:cms D: 06/13/2011 13:31:40 ET T: 06/14/2011 14:59:45 ET JOB#: [id number]  cc: Leitha SchullerMichael J. Billye Nydam, MD, <Dictator> Leitha SchullerMICHAEL J Delaine Canter MD ELECTRONICALLY SIGNED 06/15/2011 6:33
